# Patient Record
Sex: Female | Born: 1985 | Race: White | Hispanic: No | Marital: Married | Smoking: Never smoker
Health system: Southern US, Community
[De-identification: ages and names within clinical notes are randomized; demographics above are authoritative.]

## PROBLEM LIST (undated history)

## (undated) DIAGNOSIS — Z803 Family history of malignant neoplasm of breast: Secondary | ICD-10-CM

## (undated) HISTORY — PX: ABDOMINAL WALL DEFECT REPAIR: SHX53

## (undated) HISTORY — PX: WISDOM TOOTH EXTRACTION: SHX21

## (undated) HISTORY — DX: Family history of malignant neoplasm of breast: Z80.3

---

## 2015-03-06 DIAGNOSIS — N63 Unspecified lump in unspecified breast: Secondary | ICD-10-CM | POA: Insufficient documentation

## 2015-03-07 ENCOUNTER — Other Ambulatory Visit: Payer: Self-pay | Admitting: *Deleted

## 2015-03-07 DIAGNOSIS — N63 Unspecified lump in unspecified breast: Secondary | ICD-10-CM

## 2015-03-16 ENCOUNTER — Ambulatory Visit
Admission: RE | Admit: 2015-03-16 | Discharge: 2015-03-16 | Disposition: A | Discharge status: Home / Self Care | Source: Ambulatory Visit | Attending: *Deleted | Admitting: *Deleted

## 2015-03-16 ENCOUNTER — Other Ambulatory Visit: Payer: Self-pay | Admitting: *Deleted

## 2015-03-16 DIAGNOSIS — N63 Unspecified lump in unspecified breast: Secondary | ICD-10-CM

## 2015-05-25 DIAGNOSIS — IMO0002 Reserved for concepts with insufficient information to code with codable children: Secondary | ICD-10-CM | POA: Insufficient documentation

## 2015-08-24 ENCOUNTER — Other Ambulatory Visit: Payer: Self-pay | Admitting: Nurse Practitioner

## 2015-08-24 DIAGNOSIS — N6489 Other specified disorders of breast: Secondary | ICD-10-CM

## 2015-09-07 ENCOUNTER — Ambulatory Visit
Admission: RE | Admit: 2015-09-07 | Discharge: 2015-09-07 | Disposition: A | Discharge status: Home / Self Care | Source: Ambulatory Visit | Attending: Nurse Practitioner | Admitting: Nurse Practitioner

## 2015-09-07 DIAGNOSIS — N6489 Other specified disorders of breast: Secondary | ICD-10-CM

## 2015-10-12 ENCOUNTER — Encounter: Payer: Self-pay | Admitting: Family Medicine

## 2015-10-12 ENCOUNTER — Ambulatory Visit (INDEPENDENT_AMBULATORY_CARE_PROVIDER_SITE_OTHER): Admitting: Family Medicine

## 2015-10-12 VITALS — BP 101/69 | HR 68 | Temp 98.3°F | Ht 65.0 in | Wt 136.0 lb

## 2015-10-12 DIAGNOSIS — N898 Other specified noninflammatory disorders of vagina: Secondary | ICD-10-CM

## 2015-10-12 DIAGNOSIS — Z975 Presence of (intrauterine) contraceptive device: Secondary | ICD-10-CM

## 2015-10-12 DIAGNOSIS — IMO0002 Reserved for concepts with insufficient information to code with codable children: Secondary | ICD-10-CM

## 2015-10-12 DIAGNOSIS — B977 Papillomavirus as the cause of diseases classified elsewhere: Secondary | ICD-10-CM

## 2015-10-12 DIAGNOSIS — R888 Abnormal findings in other body fluids and substances: Secondary | ICD-10-CM | POA: Diagnosis not present

## 2015-10-12 DIAGNOSIS — N920 Excessive and frequent menstruation with regular cycle: Secondary | ICD-10-CM

## 2015-10-12 MED ORDER — NORETHINDRONE 0.35 MG PO TABS: 1.0000 | ORAL_TABLET | Freq: Every day | ORAL | Status: DC

## 2015-10-12 NOTE — Progress Notes (Signed)
   Subjective:    Patient ID: Paula MooresAmanda Dobbs, female    DOB: 1986-02-03, 30 y.o.   MRN: [id number]  HPI Patient seen for IUD management.  She has been having cramping over the past couple of months that has been worsening.  Improved with NSAIDs, but discomfort is worsening.  Also having irregular spotting, which has increased over the past couple of months.  Referral made for IUD removal and replacement.  Last PAP on 11/16.  +HPV, normal cytology.  I have reviewed the patients past medical, family, and social history.  I have reviewed the patient's medication list and allergies.   Review of Systems  Constitutional: Negative for fever, chills and fatigue.  Gastrointestinal: Negative for nausea, abdominal pain and diarrhea.  Endocrine: Negative for cold intolerance, heat intolerance, polydipsia, polyphagia and polyuria.  Genitourinary: Negative for urgency, vaginal discharge and dyspareunia.  All other systems reviewed and are negative.     Objective:   Physical Exam  Constitutional: She is oriented to person, place, and time. She appears well-developed and well-nourished.  HENT:  Head: Normocephalic and atraumatic.  Cardiovascular: Normal rate, regular rhythm and normal heart sounds.   Pulmonary/Chest: Effort normal and breath sounds normal.  Abdominal: Soft. Bowel sounds are normal. She exhibits no mass. There is no tenderness. There is no rebound and no guarding.  Neurological: She is alert and oriented to person, place, and time.  Skin: Skin is warm and dry. No rash noted. No erythema. No pallor.  Psychiatric: She has a normal mood and affect. Her behavior is normal. Judgment and thought content normal.      Assessment & Plan:  1. Spotting As the IUD expiration date is about 10 months - insurance will likely not cover the replacement.  Will give micronor to cover.  Will replace in 5-6 months.  2. HPV test positive Will need PAP this fall.  3. IUD (intrauterine device) in  place

## 2016-02-05 ENCOUNTER — Other Ambulatory Visit: Payer: Self-pay | Admitting: Nurse Practitioner

## 2016-02-05 DIAGNOSIS — N6489 Other specified disorders of breast: Secondary | ICD-10-CM

## 2016-02-05 DIAGNOSIS — N63 Unspecified lump in unspecified breast: Secondary | ICD-10-CM

## 2016-03-19 ENCOUNTER — Ambulatory Visit
Admission: RE | Admit: 2016-03-19 | Discharge: 2016-03-19 | Disposition: A | Discharge status: Home / Self Care | Source: Ambulatory Visit | Attending: Nurse Practitioner | Admitting: Nurse Practitioner

## 2016-03-19 ENCOUNTER — Other Ambulatory Visit: Payer: Self-pay | Admitting: Nurse Practitioner

## 2016-03-19 DIAGNOSIS — N6489 Other specified disorders of breast: Secondary | ICD-10-CM

## 2019-07-15 DIAGNOSIS — F4323 Adjustment disorder with mixed anxiety and depressed mood: Secondary | ICD-10-CM | POA: Diagnosis not present

## 2020-02-14 DIAGNOSIS — J069 Acute upper respiratory infection, unspecified: Secondary | ICD-10-CM | POA: Diagnosis not present

## 2020-05-24 DIAGNOSIS — U071 COVID-19: Secondary | ICD-10-CM | POA: Diagnosis not present

## 2021-05-10 DIAGNOSIS — Z975 Presence of (intrauterine) contraceptive device: Secondary | ICD-10-CM | POA: Diagnosis not present

## 2021-05-10 DIAGNOSIS — Z1331 Encounter for screening for depression: Secondary | ICD-10-CM | POA: Diagnosis not present

## 2021-05-10 DIAGNOSIS — N6009 Solitary cyst of unspecified breast: Secondary | ICD-10-CM | POA: Diagnosis not present

## 2021-05-10 DIAGNOSIS — E282 Polycystic ovarian syndrome: Secondary | ICD-10-CM | POA: Diagnosis not present

## 2021-05-10 DIAGNOSIS — F419 Anxiety disorder, unspecified: Secondary | ICD-10-CM | POA: Diagnosis not present

## 2021-06-07 DIAGNOSIS — Z1371 Encounter for nonprocreative screening for genetic disease carrier status: Secondary | ICD-10-CM

## 2021-06-07 DIAGNOSIS — Z9189 Other specified personal risk factors, not elsewhere classified: Secondary | ICD-10-CM

## 2021-06-07 HISTORY — DX: Encounter for nonprocreative screening for genetic disease carrier status: Z13.71

## 2021-06-07 HISTORY — DX: Other specified personal risk factors, not elsewhere classified: Z91.89

## 2021-06-13 ENCOUNTER — Ambulatory Visit: Payer: BC Managed Care – PPO | Admitting: Obstetrics

## 2021-06-13 ENCOUNTER — Encounter: Payer: Self-pay | Admitting: Obstetrics

## 2021-06-13 ENCOUNTER — Other Ambulatory Visit (HOSPITAL_COMMUNITY)
Admission: RE | Admit: 2021-06-13 | Discharge: 2021-06-13 | Disposition: A | Discharge status: Home / Self Care | Payer: BC Managed Care – PPO | Source: Ambulatory Visit | Attending: Obstetrics | Admitting: Obstetrics

## 2021-06-13 ENCOUNTER — Other Ambulatory Visit: Payer: Self-pay

## 2021-06-13 VITALS — BP 120/80 | Ht 65.0 in | Wt 144.0 lb

## 2021-06-13 DIAGNOSIS — Z1231 Encounter for screening mammogram for malignant neoplasm of breast: Secondary | ICD-10-CM | POA: Diagnosis not present

## 2021-06-13 DIAGNOSIS — Z124 Encounter for screening for malignant neoplasm of cervix: Secondary | ICD-10-CM | POA: Insufficient documentation

## 2021-06-13 DIAGNOSIS — Z8049 Family history of malignant neoplasm of other genital organs: Secondary | ICD-10-CM | POA: Diagnosis not present

## 2021-06-13 DIAGNOSIS — Z803 Family history of malignant neoplasm of breast: Secondary | ICD-10-CM
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
## 2021-06-18 LAB — CYTOLOGY - PAP
Comment: NEGATIVE
Diagnosis: NEGATIVE
Diagnosis: REACTIVE
High risk HPV: NEGATIVE

## 2021-06-26 ENCOUNTER — Encounter: Payer: Self-pay | Admitting: Obstetrics and Gynecology

## 2021-08-07 ENCOUNTER — Ambulatory Visit: Payer: BC Managed Care – PPO | Admitting: Obstetrics

## 2021-08-07 ENCOUNTER — Other Ambulatory Visit: Payer: Self-pay

## 2021-08-07 VITALS — BP 126/84 | Ht 65.0 in | Wt 150.0 lb

## 2021-08-07 DIAGNOSIS — Z975 Presence of (intrauterine) contraceptive device: Secondary | ICD-10-CM

## 2021-08-07 DIAGNOSIS — R102 Pelvic and perineal pain: Secondary | ICD-10-CM | POA: Diagnosis not present

## 2021-08-07 NOTE — Progress Notes (Signed)
° ° ° ° ° °  IUD String Check  Subjctive: Paula Graham presents for IUD string check.  tToday she comes in for evaluation of left adnexal and suprapubic pain. She admits to cramping or discomfort that is intermittant.  She has not checked the strings in quite a while.  At her first office visit to [facility] , she reported that she had not felt the strings and they were not visible to the provider. She desired to keep the IUD in place, as it had been checked via ultrasound at another office.She denies any fever, chills, nausea, vomiting, or other complaints.  For several weeks now she has had some increased pelvic pain and pressure. She is concerned that her IUD may have migrated.  Objective: BP 126/84    Ht 5\' 5"  (1.651 m)    Wt 150 lb (68 kg)    BMI 24.96 kg/m  Gen:  NAD, A&Ox3 HEENT: normocephalic, anicteric Pulmonary: no increased work of breathing Pelvic: Normal appearing external female genitalia, normal vaginal epithelium, no abnormal discharge. Her uterus is anteverted, non enlarged. No CMT, some pain with deeper palpation Normal appearing cervix.  No IUD strings visible.On bimanual exam, there is acute tenderness in the left side of the cul de sac, and some irregular fullness in that same area. Psychiatric: mood appropriate, affect full Neurologic: grossly normal  Female chaperone was present for the entirety of the pelvic exam  Assessment: 36 y.o. year old female status post prior Mirena IUD placement 5 weeks ago,  with increasing pelvic pain near the left adnexal area   Plan: Will order a pelvic ultrasound for tomorrow. Discussed that follow up would be based on the scan findings. She understands that the IUD may have migrated, or that she may have other causes of the adnexal tenderness. Will f/u in the [place] office once the sono report is read.  [place], CNM  08/07/2021 5:14 PM   08/09/2021, MD 08/07/2021 4:59 PM

## 2021-08-08 ENCOUNTER — Ambulatory Visit
Admission: RE | Admit: 2021-08-08 | Discharge: 2021-08-08 | Disposition: A | Discharge status: Home / Self Care | Payer: BC Managed Care – PPO | Source: Ambulatory Visit | Attending: Obstetrics | Admitting: Obstetrics

## 2021-08-08 DIAGNOSIS — Z975 Presence of (intrauterine) contraceptive device: Secondary | ICD-10-CM

## 2021-08-10 ENCOUNTER — Other Ambulatory Visit: Payer: Self-pay | Admitting: Obstetrics

## 2021-08-10 ENCOUNTER — Telehealth: Payer: Self-pay | Admitting: Obstetrics

## 2021-08-10 DIAGNOSIS — R102 Pelvic and perineal pain: Secondary | ICD-10-CM

## 2021-08-10 DIAGNOSIS — N83201 Unspecified ovarian cyst, right side: Secondary | ICD-10-CM

## 2021-08-10 NOTE — Telephone Encounter (Signed)
Pt called in about the imaging that she had done on 2/1.  She was wondering about the results.

## 2021-08-10 NOTE — Progress Notes (Signed)
Called patient to discuss the ultrasound findings. Her pelvic pain has greatly diminished. The Pelvic ultrasound revealed a  complex right ovarian cyst. Radiology recommends repeating the scan in two months. Order placed and the patient has been called and given a review of the sono report.  She will contact our office in the meantime should her pelvic pain return.  Mirna Mires, CNM  08/10/2021 5:28 PM

## 2021-08-13 ENCOUNTER — Telehealth: Payer: Self-pay | Admitting: Obstetrics

## 2021-08-13 NOTE — Telephone Encounter (Signed)
Pt called in following up about labs and U/S from last week. And asked if you could call her back. Thanks

## 2021-08-14 ENCOUNTER — Telehealth: Payer: Self-pay | Admitting: Obstetrics & Gynecology

## 2021-08-14 NOTE — Telephone Encounter (Signed)
Called pt and the mailbox was not set up.  Called pt to schedule appt with [facility] concerning a right ovarian cyst.  (Per [facility])

## 2021-08-21 ENCOUNTER — Telehealth: Payer: Self-pay

## 2021-08-21 ENCOUNTER — Other Ambulatory Visit: Payer: Self-pay

## 2021-08-21 ENCOUNTER — Encounter: Payer: Self-pay | Admitting: Obstetrics & Gynecology

## 2021-08-21 ENCOUNTER — Ambulatory Visit: Payer: BC Managed Care – PPO | Admitting: Obstetrics & Gynecology

## 2021-08-21 VITALS — BP 120/80 | Ht 65.0 in | Wt 148.0 lb

## 2021-08-21 DIAGNOSIS — R102 Pelvic and perineal pain: Secondary | ICD-10-CM | POA: Diagnosis not present

## 2021-08-21 DIAGNOSIS — N83201 Unspecified ovarian cyst, right side: Secondary | ICD-10-CM

## 2021-08-21 NOTE — Telephone Encounter (Signed)
Patient is scheduled for 08/21/21

## 2021-08-21 NOTE — Telephone Encounter (Signed)
Patient is scheduled for 09/04/21 with PH for mirena exchange

## 2021-08-21 NOTE — Progress Notes (Signed)
° °  Gynecology Pelvic Pain Evaluation   Chief Complaint:  Chief Complaint  Patient presents with   Ovarian Cyst    Right side    History of Present Illness:   Patient is a 36 y.o. [id number] who LMP was No LMP recorded. (Menstrual status: IUD)., presents today for a problem visit.  She complains of pain.   Her pain is localized to the RLQ area, described as constant, sharp, and stabbing, began several months ago and its severity is described as moderate. The pain radiates to the  Non-radiating. She has these associated symptoms which include none. Patient has these modifiers which include nothing that make it better and unable to associate with any factor that make it worse.  Context includes: No periods as has Mirena IUD (YEAR 5) and usually exchanges IUD by this time to keep periods away.   Previous evaluation: ultrasound showing 2 cm right ovarian complex cyst.  Prior Diagnosis:  h/o PCOS in past . Previous Treatment: Mirena IUD to suppress PCOS and suppress periods as they were prolongd.  PMHx: She  has a past medical history of BRCA negative (06/2021), Family history of breast cancer, and Increased risk of breast cancer (06/2021). Also,  has a past surgical history that includes Wisdom tooth extraction and Abdominal wall defect repair., family history includes Breast cancer in her mother; Cervical cancer in her cousin, mother, and sister.,  reports that she has never smoked. She has never used smokeless tobacco. She reports current alcohol use. She reports that she does not use drugs.  She has a current medication list which includes the following prescription(s): levonorgestrel. Also, is allergic to penicillins.  Review of Systems  All other systems reviewed and are negative.  Objective: BP 120/80    Ht $R'5\' 5"'bd$  (1.651 m)    Wt 148 lb (67.1 kg)    BMI 24.63 kg/m  Physical Exam Constitutional:      General: She is not in acute distress.    Appearance: She is well-developed.   Musculoskeletal:        General: Normal range of motion.  Neurological:     Mental Status: She is alert and oriented to person, place, and time.  Skin:    General: Skin is warm and dry.  Vitals reviewed.    Female chaperone present for pelvic portion of the physical exam  Assessment: 36 y.o. [id number] with  RLQ pain .  1. Cyst of right ovary Pain may be from cyst, or form uterus US 2 mos f/u Cyst small, low risk for causative etiology, rupture, torsion, cancer  2. Pelvic pain May be from uterus, cyst, other IUD exchange reasonable    May improve hormone levels and perhaps pain    Has helped in past when exchanged    Understands 8 yr contraception lifespan of IUD, and also that tx for menorrhagia is still 5 yrs or really based on break thru sx's Will schedule as she has kids w her today  A total of 33 minutes were spent face-to-face with the patient as well as preparation, review, communication, and documentation during this encounter.   Barnett Applebaum, MD, [facility], [facility] 08/21/2021  4:07 PM

## 2021-08-22 NOTE — Telephone Encounter (Signed)
Noted. Will order to arrive by apt date/time. 

## 2021-08-28 NOTE — Telephone Encounter (Signed)
Noted. Mirena reserved for this patient. 

## 2021-08-28 NOTE — Telephone Encounter (Addendum)
Patient is scheduled for 08/28/21 with Dr. Ralene Bathe for replacement

## 2021-08-29 ENCOUNTER — Encounter: Payer: Self-pay | Admitting: Family Medicine

## 2021-08-29 ENCOUNTER — Ambulatory Visit (INDEPENDENT_AMBULATORY_CARE_PROVIDER_SITE_OTHER): Payer: BC Managed Care – PPO | Admitting: Family Medicine

## 2021-08-29 ENCOUNTER — Other Ambulatory Visit: Payer: Self-pay

## 2021-08-29 ENCOUNTER — Other Ambulatory Visit: Payer: Self-pay | Admitting: Family Medicine

## 2021-08-29 VITALS — BP 122/72 | Ht 65.0 in | Wt 149.0 lb

## 2021-08-29 DIAGNOSIS — Z30432 Encounter for removal of intrauterine contraceptive device: Secondary | ICD-10-CM | POA: Diagnosis not present

## 2021-08-29 DIAGNOSIS — Z30430 Encounter for insertion of intrauterine contraceptive device: Secondary | ICD-10-CM | POA: Diagnosis not present

## 2021-08-29 DIAGNOSIS — Z3009 Encounter for other general counseling and advice on contraception: Secondary | ICD-10-CM | POA: Diagnosis not present

## 2021-08-29 DIAGNOSIS — Z30433 Encounter for removal and reinsertion of intrauterine contraceptive device: Secondary | ICD-10-CM | POA: Diagnosis not present

## 2021-08-29 MED ORDER — LEVONORGESTREL 20 MCG/DAY IU IUD: 1.0000 | INTRAUTERINE_SYSTEM | Freq: Once | INTRAUTERINE | 0 refills | Status: AC

## 2021-08-29 NOTE — Progress Notes (Addendum)
Contraception/Family Planning VISIT ENCOUNTER NOTE  Subjective:   Paula Graham is a 36 y.o. G12P2002 female here for reproductive life counseling.  Desires effectiveness from [facility].  This will be 4th IUD and likes method. Has 20 and 36 yo. Reports she does not want a pregnancy in the next year. Denies abnormal vaginal bleeding, discharge, pelvic pain, problems with intercourse or other gynecologic concerns.    Gynecologic History No LMP recorded. (Menstrual status: IUD). Contraception: IUD  Health Maintenance Due  Topic Date Due   COVID-19 Vaccine (1) Never done   HIV Screening  Never done   Hepatitis C Screening  Never done   TETANUS/TDAP  Never done     The following portions of the patient's history were reviewed and updated as appropriate: allergies, current medications, past family history, past medical history, past social history, past surgical history and problem list.  Review of Systems Pertinent items are noted in HPI.   Objective:  BP 122/72    Ht 5\' 5"  (1.651 m)    Wt 149 lb (67.6 kg)    BMI 24.79 kg/m  Gen: well appearing, NAD HEENT: no scleral icterus CV: RR Lung: Normal WOB Ext: warm well perfused  PELVIC: Normal appearing external genitalia; normal appearing vaginal mucosa and cervix.  No abnormal discharge noted.   Normal uterine size, no other palpable masses, no uterine or adnexal tenderness.  IUD Removal  Patient identified, informed consent performed, consent signed.  Patient was in the dorsal lithotomy position, normal external genitalia was noted.  A speculum was placed in the patient's vagina, normal discharge was noted, no lesions. The cervix was visualized, no lesions, no abnormal discharge.  The strings of the IUD were not visualized, cytobrush was attempted which was unsuccessful so Kelly forceps were introduced into the endometrial cavity and the IUD was grasped and removed in its entirety).  Patient tolerated the procedure well.     IUD  Insertion Procedure Note Patient identified, informed consent performed, consent signed.   Discussed risks of irregular bleeding, cramping, infection, malpositioning or misplacement of the IUD outside the uterus which may require further procedure such as laparoscopy. Time out was performed.  Urine pregnancy test negative.  Speculum placed in the vagina.  Cervix visualized.  Cleaned with Betadine x 2.  Grasped anteriorly with a single tooth tenaculum.  Uterus sounded to 8 cm.  IUD placed per manufacturer's recommendations.  Strings trimmed to 3 cm. Tenaculum was removed, good hemostasis noted.  Patient tolerated procedure well.     Assessment and Plan:   Contraception counseling: Reviewed all forms of birth control options available including abstinence; over the counter/barrier methods; hormonal contraceptive medication including pill, patch, ring, injection,contraceptive implant; hormonal and nonhormonal IUDs; permanent sterilization options including vasectomy and the various tubal sterilization modalities. Risks and benefits reviewed.  Questions were answered.  Written information was also given to the patient to review.  Patient desires IUD, this was prescribed for patient. She will follow up in  3-4 weeks for surveillance if unable to palpate strings  She was told to call with any further questions, or with any concerns about this method of contraception.  Emphasized use of condoms 100% of the time for STI prevention.  1. Encounter for IUD insertion Patient was given post-procedure instructions.  She was advised to have backup contraception for one week.  Patient was also asked to check IUD strings periodically and follow up in 4 weeks for IUD check.  2. Encounter for IUD  removal Removed easily  3. Family planning  4. Encounter for removal and reinsertion of intrauterine contraceptive device (IUD)    Please refer to After Visit Summary for other counseling recommendations.   Return in  about 4 weeks (around 09/26/2021) for IUD string check PRN if cannot feel strings.  Federico Flake, MD, MPH, ABFM Attending Physician Faculty Practice- [facility]

## 2021-09-04 ENCOUNTER — Ambulatory Visit: Payer: BC Managed Care – PPO | Admitting: Obstetrics & Gynecology

## 2021-09-10 ENCOUNTER — Ambulatory Visit: Payer: BC Managed Care – PPO | Admitting: Obstetrics & Gynecology

## 2021-10-01 NOTE — Telephone Encounter (Signed)
Mirena rcvd/charged 08/29/2021 ?

## 2021-12-05 ENCOUNTER — Other Ambulatory Visit: Payer: Self-pay | Admitting: Obstetrics

## 2021-12-05 DIAGNOSIS — R102 Pelvic and perineal pain: Secondary | ICD-10-CM

## 2021-12-05 DIAGNOSIS — N83201 Unspecified ovarian cyst, right side: Secondary | ICD-10-CM

## 2021-12-11 ENCOUNTER — Ambulatory Visit
Admission: RE | Admit: 2021-12-11 | Discharge: 2021-12-11 | Disposition: A | Discharge status: Home / Self Care | Payer: BC Managed Care – PPO | Source: Ambulatory Visit | Attending: Obstetrics | Admitting: Obstetrics

## 2021-12-11 DIAGNOSIS — N83201 Unspecified ovarian cyst, right side: Secondary | ICD-10-CM

## 2021-12-11 DIAGNOSIS — R102 Pelvic and perineal pain: Secondary | ICD-10-CM

## 2022-04-09 DIAGNOSIS — N3000 Acute cystitis without hematuria: Secondary | ICD-10-CM | POA: Diagnosis not present

## 2022-06-03 DIAGNOSIS — S39012A Strain of muscle, fascia and tendon of lower back, initial encounter: Secondary | ICD-10-CM | POA: Diagnosis not present

## 2023-07-22 ENCOUNTER — Other Ambulatory Visit: Payer: Self-pay | Admitting: Obstetrics & Gynecology

## 2023-07-22 DIAGNOSIS — R928 Other abnormal and inconclusive findings on diagnostic imaging of breast: Secondary | ICD-10-CM

## 2023-08-02 ENCOUNTER — Ambulatory Visit
Admission: RE | Admit: 2023-08-02 | Discharge: 2023-08-02 | Disposition: A | Discharge status: Home / Self Care | Payer: BC Managed Care – PPO | Source: Ambulatory Visit | Attending: Obstetrics & Gynecology | Admitting: Obstetrics & Gynecology

## 2023-08-02 ENCOUNTER — Ambulatory Visit
Admission: RE | Admit: 2023-08-02 | Discharge: 2023-08-02 | Disposition: A | Discharge status: Home / Self Care | Payer: No Typology Code available for payment source | Source: Ambulatory Visit | Attending: Obstetrics & Gynecology | Admitting: Obstetrics & Gynecology

## 2023-08-02 DIAGNOSIS — R928 Other abnormal and inconclusive findings on diagnostic imaging of breast: Secondary | ICD-10-CM

## 2023-08-06 ENCOUNTER — Other Ambulatory Visit: Payer: Self-pay | Admitting: Obstetrics & Gynecology

## 2023-08-06 DIAGNOSIS — Z9189 Other specified personal risk factors, not elsewhere classified: Secondary | ICD-10-CM

## 2023-10-19 IMAGING — US US PELVIS COMPLETE WITH TRANSVAGINAL
1 series · 13 of 25 positions shown · non-contrast
Comparison: 08/08/2021

CLINICAL DATA: Follow-up ovarian cyst, as IUD, unknown LMP due to
IUD

EXAM:
TRANSABDOMINAL AND TRANSVAGINAL ULTRASOUND OF PELVIS
TECHNIQUE: Both transabdominal and transvaginal ultrasound examinations of the
pelvis were performed. Transabdominal technique was performed for
global imaging of the pelvis including uterus, ovaries, adnexal
regions, and pelvic cul-de-sac. It was necessary to proceed with
endovaginal exam following the transabdominal exam to visualize the
endometrium and ovaries.

[Series 1: us pelvis complete with transvaginal · 0.25mm/px · 117 acquisitions, 13 frames shown]
[im 1/117]
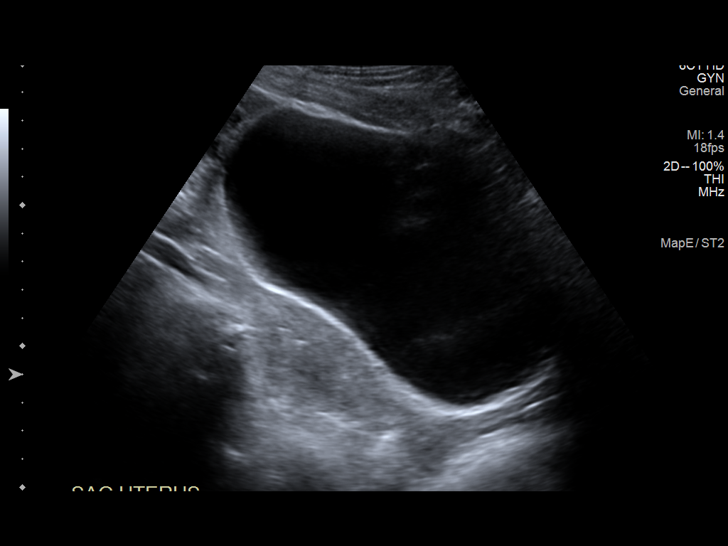
[im 10/117]
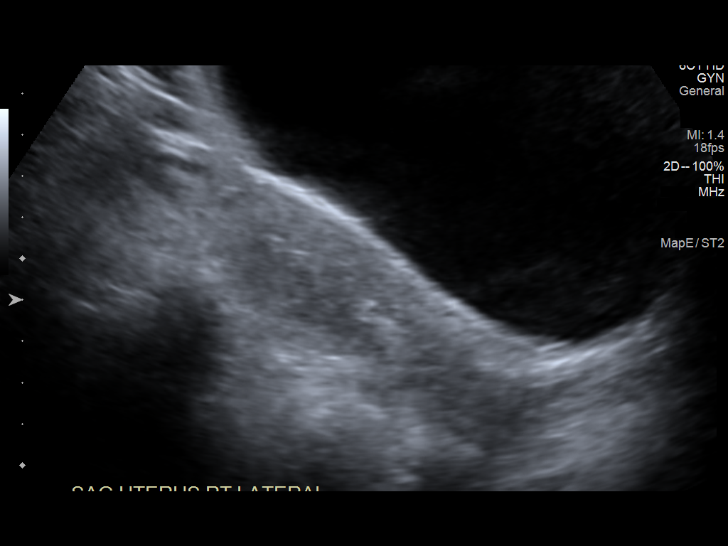
[im 20/117]
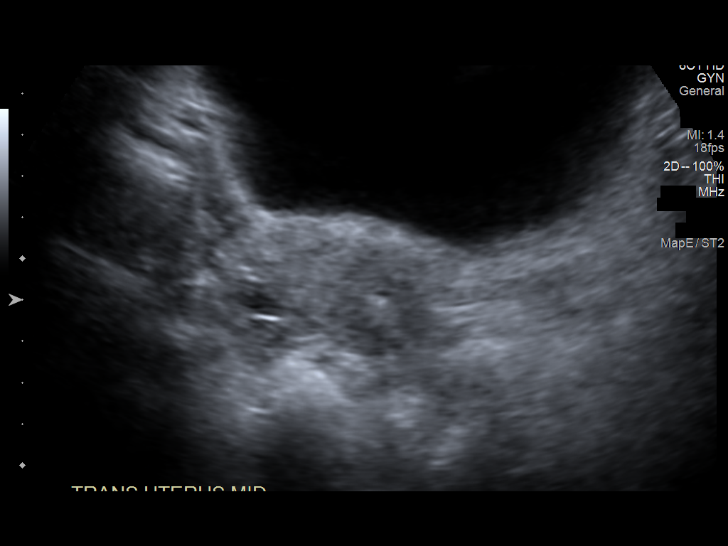
[im 30/117]
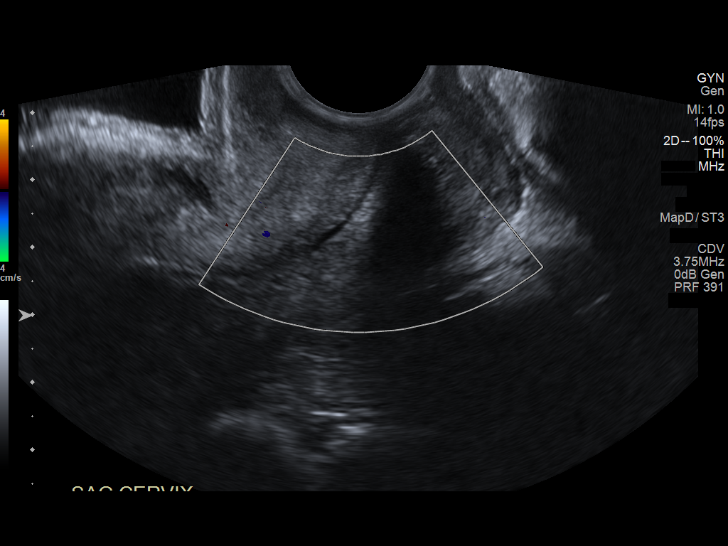
[im 39/117]
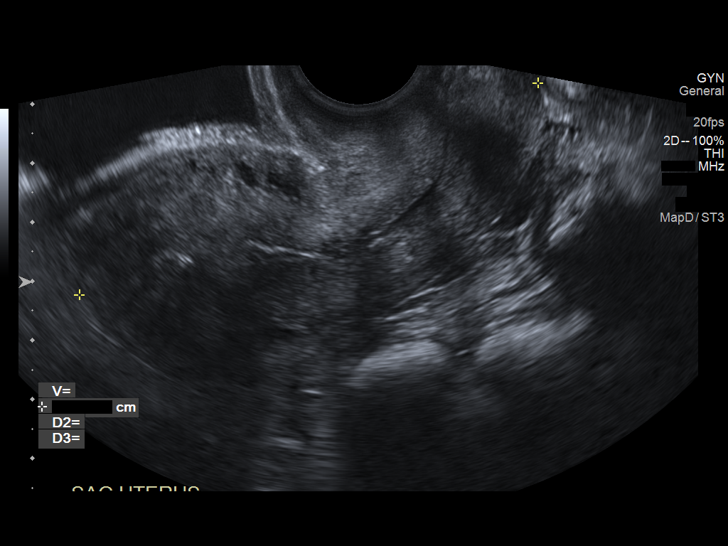
[im 49/117]
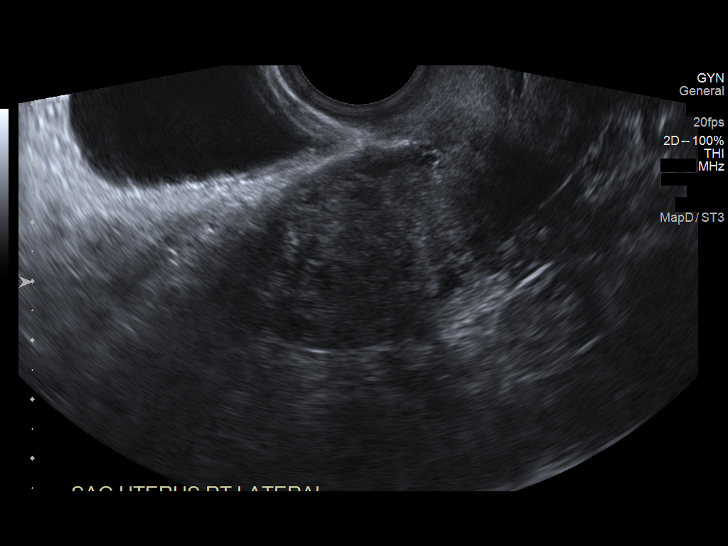
[im 59/117]
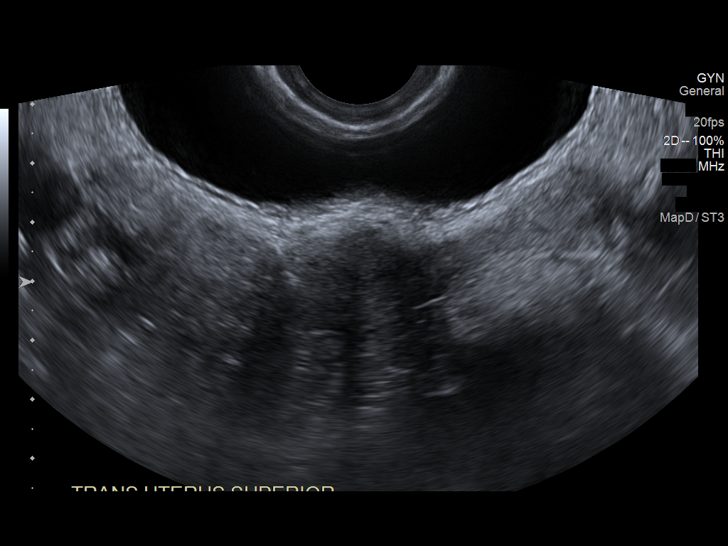
[im 68/117]
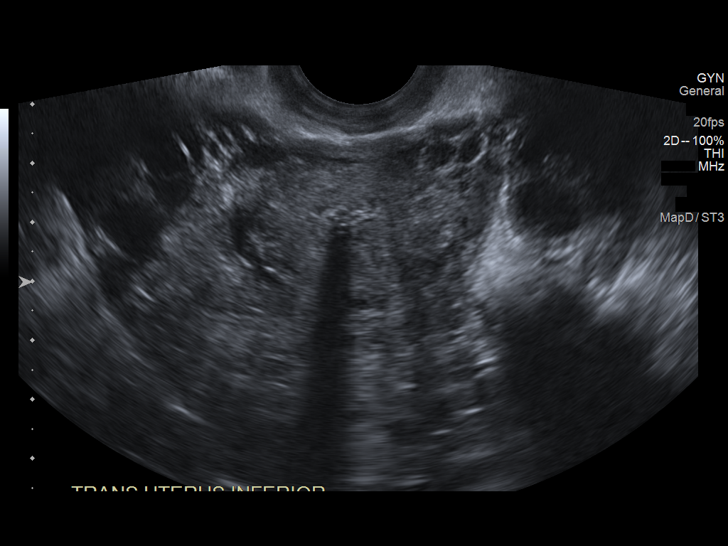
[im 78/117]
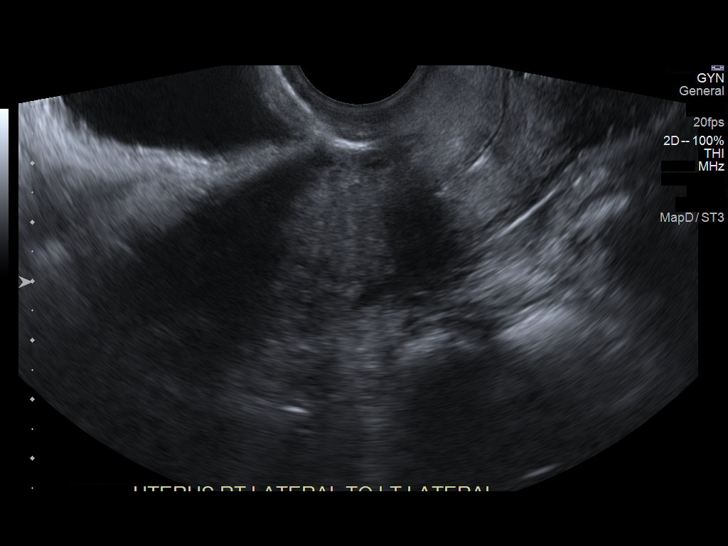
[im 88/117]
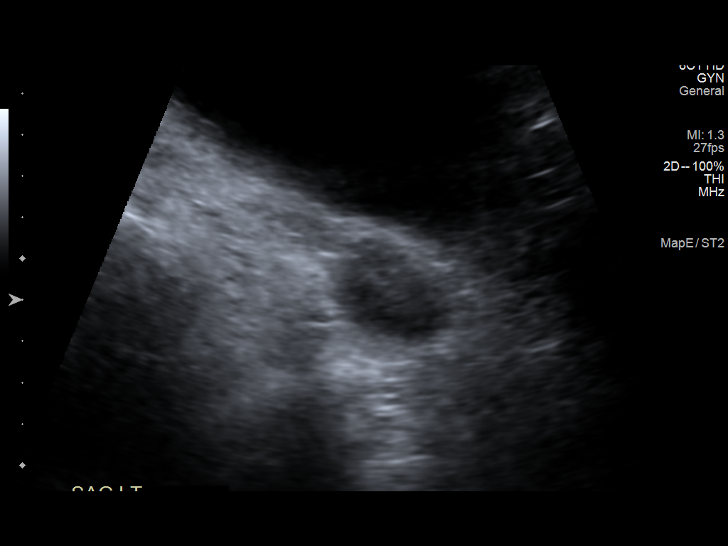
[im 97/117]
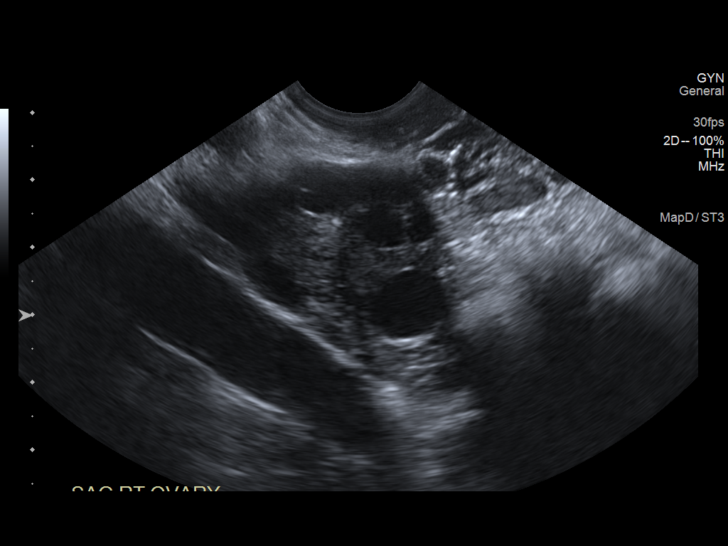
[im 107/117]
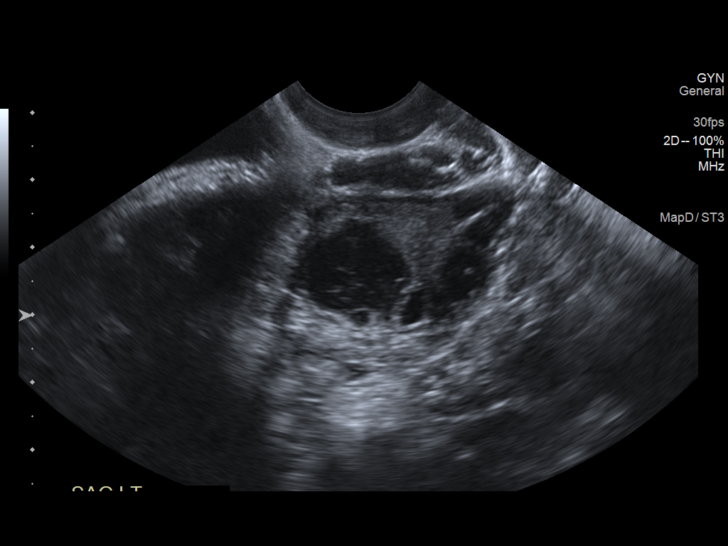
[im 117/117]
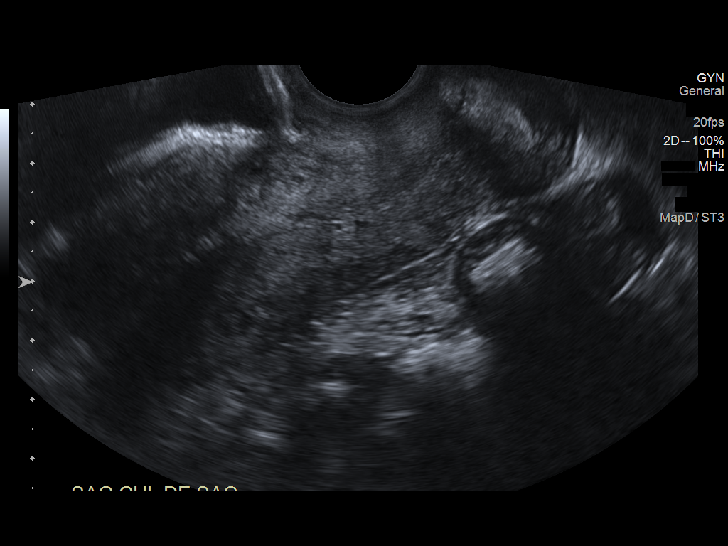

[13 of 25 positions shown; findings below may reference images not displayed]

FINDINGS: Uterus

Measurements: 8.6 x 4.0 x 4.9 cm = volume: 87 mL. Anteverted.
Heterogeneous myometrium. No focal mass.

Endometrium

Thickness: 1 mm. IUD in expected position at upper uterine segment
endometrial canal. Trace nonspecific endometrial fluid. No
endometrial mass.

Right ovary

Measurements: 3.7 x 2.3 x 2.5 cm = volume: 11.4 mL. Normal
morphology without mass. Small hemorrhagic cyst of the RIGHT ovary
seen on the previous exam resolved

Left ovary

Measurements: 2.9 x 1.9 x 2.8 cm = volume: 8.1 mL. Small hemorrhagic
corpus luteum LEFT ovary 1.5 cm diameter; no follow-up imaging
recommended.

Other findings

No free pelvic fluid.  No adnexal masses.
IMPRESSION: IUD in expected position at the upper uterine segment endometrial
canal.

Resolution of previously identified hemorrhagic cyst RIGHT ovary.

No new significant pelvic sonographic abnormalities.

## 2024-01-10 ENCOUNTER — Other Ambulatory Visit: Payer: No Typology Code available for payment source
